# Patient Record
Sex: Male | Born: 1990 | Race: Black or African American | Hispanic: No | Marital: Single | Smoking: Current some day smoker
Health system: Southern US, Community
[De-identification: ages and names within clinical notes are randomized; demographics above are authoritative.]

## PROBLEM LIST (undated history)

## (undated) DIAGNOSIS — J45909 Unspecified asthma, uncomplicated: Secondary | ICD-10-CM

---
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
## 2012-08-04 ENCOUNTER — Ambulatory Visit: Payer: BC Managed Care – PPO

## 2012-08-04 ENCOUNTER — Ambulatory Visit (INDEPENDENT_AMBULATORY_CARE_PROVIDER_SITE_OTHER): Payer: BC Managed Care – PPO | Admitting: Family Medicine

## 2012-08-04 VITALS — BP 110/60 | HR 67 | Temp 98.4°F | Resp 16 | Ht 67.5 in | Wt 153.6 lb

## 2012-08-04 DIAGNOSIS — R634 Abnormal weight loss: Secondary | ICD-10-CM

## 2012-08-04 DIAGNOSIS — K5900 Constipation, unspecified: Secondary | ICD-10-CM

## 2012-08-04 DIAGNOSIS — R109 Unspecified abdominal pain: Secondary | ICD-10-CM

## 2012-08-04 DIAGNOSIS — R112 Nausea with vomiting, unspecified: Secondary | ICD-10-CM

## 2012-08-04 LAB — POCT CBC
Granulocyte percent: 53.3 %G (ref 37–80)
HCT, POC: 52 % (ref 43.5–53.7)
Hemoglobin: 17.5 g/dL (ref 14.1–18.1)
Lymph, poc: 2.5 (ref 0.6–3.4)
MCH, POC: 30.8 pg (ref 27–31.2)
MCHC: 33.7 g/dL (ref 31.8–35.4)
MCV: 91.6 fL (ref 80–97)
MID (cbc): 0.7 (ref 0–0.9)
MPV: 10.4 fL (ref 0–99.8)
POC Granulocyte: 3.7 (ref 2–6.9)
POC LYMPH PERCENT: 36.3 %L (ref 10–50)
POC MID %: 10.4 %M (ref 0–12)
Platelet Count, POC: 311 10*3/uL (ref 142–424)
RBC: 5.68 M/uL (ref 4.69–6.13)
RDW, POC: 12.8 %
WBC: 7 10*3/uL (ref 4.6–10.2)

## 2012-08-04 LAB — POCT URINALYSIS DIPSTICK
Bilirubin, UA: NEGATIVE
Blood, UA: NEGATIVE
Glucose, UA: NEGATIVE
Ketones, UA: NEGATIVE
Leukocytes, UA: NEGATIVE
Nitrite, UA: NEGATIVE
Protein, UA: 100
Spec Grav, UA: 1.025
Urobilinogen, UA: 0.2
pH, UA: 6

## 2012-08-04 NOTE — Progress Notes (Signed)
[facility]:  Office Visit  Chief Complaint:  Chief Complaint  Patient presents with  . Abdominal Pain    nausea vomiting diarrhea constipation last tues    HPI: Jon Clay is a 21 y.o. male who complains of 8 day h/o abd pain.  Abdominal pain diffuse, pressure,, bloating and has both burping and flatulence. Last BM 3-4 days ago.  Normal BM is 2 days. Last BM was watery dark, non bloody. Deneis fevers. + nausea, + chills, + vomitus initially. Prior hisotry of gastroenteritis x 4. Not associated with dairy products, fatty foods. + bilateral back pain. + heartburn, acid taste with burp bad breath.   History reviewed. No pertinent past medical history. History reviewed. No pertinent past surgical history. History   Social History  . Marital Status: Single    Spouse Name: N/A    Number of Children: N/A  . Years of Education: N/A   Social History Main Topics  . Smoking status: Never Smoker   . Smokeless tobacco: None  . Alcohol Use: No  . Drug Use: No  . Sexually Active: None   Other Topics Concern  . None   Social History Narrative  . None   No family history on file. No Known Allergies Prior to Admission medications   Medication Sig Start Date End Date Taking? Authorizing Provider  alum & mag hydroxide-simeth (MAALOX PLUS) 400-400-40 MG/5ML suspension Take by mouth every 6 (six) hours as needed.   Yes Historical Provider, MD  omeprazole (PRILOSEC) 20 MG capsule Take 20 mg by mouth daily.   Yes Historical Provider, MD     ROS: The patient denies fevers, chills, night sweats, unintentional weight loss, chest pain, palpitations, wheezing, dyspnea on exertion,  dysuria, hematuria, melena, numbness, weakness, or tingling.   All other systems have been reviewed and were otherwise negative with the exception of those mentioned in the HPI and as above.    PHYSICAL EXAM: Filed Vitals:   08/04/12 1801  BP: 110/60  Pulse: 67  Temp: 98.4 F (36.9 C)    Resp: 16   Filed Vitals:   08/04/12 1801  Height: 5' 7.5" (1.715 m)  Weight: 153 lb 9.6 oz (69.673 kg)   Body mass index is 23.70 kg/(m^2).  General: Alert, no acute distress HEENT:  Normocephalic, atraumatic, oropharynx patent.  Cardiovascular:  Regular rate and rhythm, no rubs murmurs or gallops.  No Carotid bruits, radial pulse intact. No pedal edema.  Respiratory: Clear to auscultation bilaterally.  No wheezes, rales, or rhonchi.  No cyanosis, no use of accessory musculature GI: No organomegaly, abdomen is soft and currently non-tender, positive bowel sounds.  No masses. Skin: No rashes. Neurologic: Facial musculature symmetric. Psychiatric: Patient is appropriate throughout our interaction. Lymphatic: No cervical lymphadenopathy Musculoskeletal: Gait intact.   LABS: Results for orders placed in visit on 08/04/12  POCT CBC      Component Value Range   WBC 7.0  4.6 - 10.2 K/uL   Lymph, poc 2.5  0.6 - 3.4   POC LYMPH PERCENT 36.3  10 - 50 %L   MID (cbc) 0.7  0 - 0.9   POC MID % 10.4  0 - 12 %M   POC Granulocyte 3.7  2 - 6.9   Granulocyte percent 53.3  37 - 80 %G   RBC 5.68  4.69 - 6.13 M/uL   Hemoglobin 17.5  14.1 - 18.1 g/dL   HCT, POC 45.4  09.8 - 53.7 %   MCV 91.6  80 - 97 fL   MCH, POC 30.8  27 - 31.2 pg   MCHC 33.7  31.8 - 35.4 g/dL   RDW, POC 16.1     Platelet Count, POC 311  142 - 424 K/uL   MPV 10.4  0 - 99.8 fL  POCT URINALYSIS DIPSTICK      Component Value Range   Color, UA yellow     Clarity, UA hazy     Glucose, UA neg     Bilirubin, UA neg     Ketones, UA neg     Spec Grav, UA 1.025     Blood, UA neg     pH, UA 6.0     Protein, UA 100     Urobilinogen, UA 0.2     Nitrite, UA neg     Leukocytes, UA Negative       EKG/XRAY:   Primary read interpreted by Dr. Conley Rolls at [facility]. No acute cardiopulmonary process Abdomen no free air, unobstructed gas pattern, + stool   ASSESSMENT/PLAN: Encounter Diagnoses  Name Primary?  . Abdominal pain Yes  .  Nausea & vomiting   . Weight loss   . Constipation    Take prilosec BID Regiment of Miralax, Senokot S, Colace May be just GERD vs PUD vs Gall bladder disease. Lab pending [phone]    Rockne Coons, DO 08/04/2012 7:07 PM

## 2012-08-05 LAB — COMPREHENSIVE METABOLIC PANEL
ALT: 12 U/L (ref 0–53)
AST: 16 U/L (ref 0–37)
Albumin: 5.6 g/dL — ABNORMAL HIGH (ref 3.5–5.2)
Alkaline Phosphatase: 58 U/L (ref 39–117)
BUN: 17 mg/dL (ref 6–23)
CO2: 29 mEq/L (ref 19–32)
Calcium: 11.4 mg/dL — ABNORMAL HIGH (ref 8.4–10.5)
Chloride: 87 mEq/L — ABNORMAL LOW (ref 96–112)
Creat: 1.26 mg/dL (ref 0.50–1.35)
Glucose, Bld: 121 mg/dL — ABNORMAL HIGH (ref 70–99)
Potassium: 3.8 mEq/L (ref 3.5–5.3)
Sodium: 135 mEq/L (ref 135–145)
Total Bilirubin: 1.5 mg/dL — ABNORMAL HIGH (ref 0.3–1.2)
Total Protein: 9.1 g/dL — ABNORMAL HIGH (ref 6.0–8.3)

## 2012-08-05 LAB — LIPASE: Lipase: <10 U/L (ref 0–75)

## 2012-08-05 LAB — HEPATITIS B SURFACE ANTIBODY, QUANTITATIVE: Hep B S AB Quant (Post): 1 m[IU]/mL

## 2012-08-05 LAB — HEPATITIS C ANTIBODY: HCV Ab: NEGATIVE

## 2012-08-05 LAB — AMYLASE: Amylase: 56 U/L (ref 0–105)

## 2012-08-08 ENCOUNTER — Encounter: Payer: Self-pay | Admitting: Family Medicine

## 2012-08-09 ENCOUNTER — Other Ambulatory Visit: Payer: Self-pay | Admitting: Family Medicine

## 2012-08-09 DIAGNOSIS — B9681 Helicobacter pylori [H. pylori] as the cause of diseases classified elsewhere: Secondary | ICD-10-CM

## 2012-08-09 LAB — HELICOBACTER PYLORI  ANTIBODY, IGM: Helicobacter pylori, IgM: 14 U/mL — ABNORMAL HIGH (ref <9.0)

## 2012-08-09 MED ORDER — METRONIDAZOLE 500 MG PO TABS: 500.0000 mg | ORAL_TABLET | Freq: Two times a day (BID) | ORAL | Status: AC

## 2012-08-09 MED ORDER — CLARITHROMYCIN 500 MG PO TABS: 500.0000 mg | ORAL_TABLET | Freq: Two times a day (BID) | ORAL | Status: AC

## 2012-08-10 ENCOUNTER — Encounter: Payer: Self-pay | Admitting: Family Medicine

## 2012-08-26 ENCOUNTER — Telehealth: Payer: Self-pay

## 2012-08-26 NOTE — Telephone Encounter (Signed)
Advised pt that it would be best for him to RTC to see Dr Conley Rolls so that he can discuss w/her what is needed in a letter and to have her recheck him. Dr Conley Rolls wanted to recheck his calcium level also. Pt agreed to come in this weekend.

## 2012-08-26 NOTE — Telephone Encounter (Signed)
Pt came into clinic yesterday to have proof of his diagnosis for stomach issues from dr Conley Rolls. i printed copy of his ov note and letter dr Conley Rolls sent him with diagnosis included. Pt needs for school purposes. Pt called today to state school would only accept that to cover that one day. Pt states he has experienced 'flare ups' for the past two years on average of twice a month resulting in missing class for up to 3 or 4 days total. He asks that dr Conley Rolls call him to discuss writing a more detailed letter for school. He said he might come in on Sunday during her office hours to see her as an ov if he doesn't hear from her by then. States its very important and time sensitive.   Pt best: [phone]  bf

## 2012-08-28 ENCOUNTER — Ambulatory Visit (INDEPENDENT_AMBULATORY_CARE_PROVIDER_SITE_OTHER): Payer: BC Managed Care – PPO | Admitting: Family Medicine

## 2012-08-28 VITALS — BP 122/68 | HR 83 | Temp 98.7°F | Resp 17 | Ht 68.0 in | Wt 165.0 lb

## 2012-08-28 DIAGNOSIS — R102 Pelvic and perineal pain: Secondary | ICD-10-CM

## 2012-08-28 DIAGNOSIS — R197 Diarrhea, unspecified: Secondary | ICD-10-CM

## 2012-08-28 DIAGNOSIS — R109 Unspecified abdominal pain: Secondary | ICD-10-CM

## 2012-08-28 DIAGNOSIS — R6889 Other general symptoms and signs: Secondary | ICD-10-CM

## 2012-08-28 DIAGNOSIS — R350 Frequency of micturition: Secondary | ICD-10-CM

## 2012-08-28 DIAGNOSIS — R110 Nausea: Secondary | ICD-10-CM

## 2012-08-28 LAB — POCT CBC
Granulocyte percent: 54.5 %G (ref 37–80)
HCT, POC: 46.6 % (ref 43.5–53.7)
Hemoglobin: 14.8 g/dL (ref 14.1–18.1)
Lymph, poc: 1.8 (ref 0.6–3.4)
MCH, POC: 30.3 pg (ref 27–31.2)
MCHC: 31.8 g/dL (ref 31.8–35.4)
MCV: 95.2 fL (ref 80–97)
MID (cbc): 0.4 (ref 0–0.9)
MPV: 9.8 fL (ref 0–99.8)
POC Granulocyte: 2.7 (ref 2–6.9)
POC LYMPH PERCENT: 36.7 %L (ref 10–50)
POC MID %: 8.8 %M (ref 0–12)
Platelet Count, POC: 268 10*3/uL (ref 142–424)
RBC: 4.89 M/uL (ref 4.69–6.13)
RDW, POC: 14.8 %
WBC: 4.9 10*3/uL (ref 4.6–10.2)

## 2012-08-28 LAB — POCT URINALYSIS DIPSTICK
Bilirubin, UA: NEGATIVE
Blood, UA: NEGATIVE
Glucose, UA: NEGATIVE
Ketones, UA: NEGATIVE
Leukocytes, UA: NEGATIVE
Nitrite, UA: NEGATIVE
Protein, UA: NEGATIVE
Spec Grav, UA: 1.02
Urobilinogen, UA: 0.2
pH, UA: 7.5

## 2012-08-28 LAB — POCT UA - MICROSCOPIC ONLY
Casts, Ur, LPF, POC: NEGATIVE
Crystals, Ur, HPF, POC: NEGATIVE
Epithelial cells, urine per micros: NEGATIVE
Mucus, UA: NEGATIVE
Yeast, UA: NEGATIVE

## 2012-08-28 NOTE — Patient Instructions (Addendum)
We will refer you to a gastroenterologist.  Call if you have a specific one you would like to be referred to.  Bland diet, avoid fast food, spicy food, fried or greasy foods.  Continue prilosec twice per day. Your should receive a call or letter about your lab results within the next week to 10 days.  Let us know the previous places of treatment so we can request records.  Return to the clinic or go to the nearest emergency room if any of your symptoms worsen or new symptoms occur.

## 2012-08-28 NOTE — Progress Notes (Signed)
Subjective:    Patient ID: Jon Clay, male    DOB: 10/23/1991, 21 y.o.   MRN: [id number]  HPI Jon Clay is a 21 y.o. male Seen by Dr. Conley Rolls previously on 08/04/12 with 8 day hx of abdominal cramping.  He states he was given information form Dr. Conley Rolls that this may have been condition since childhood. Needs note.  Drove from [place]. Moving back to [place] this week.   Last in school from August through may of this year in [place] - [employer].  Did not enroll in this year's term.  Not in school now, planning on going back to school  - [employer]. Has to file appeal due to academic probation at [employer] spring 2012  - due to 23 days of missed classes. Missed too many days.   Attributes these missed days to repeated episodes of gastroenteritis.  Seen by nurse that was a family friend that year at home.  No office visit for these illnesses. Never received doctor's note during those spells in 2012.  Seen by [facility] health care in [place] during spring of this year for these spells.  Admitted to [facility] in [place], [place] in  January of this year, and [facility] in [place], [place] - end of March this year.    12 big "flare ups" over past 5 years - vomiting, and constipation feels better for few hours, spells go on 6 to 10 days. can't go to school during these spells. Has had multiple smaller flares in the past.  Has not seen Gastroenterologist.  Treated with prilosec bid at 08/04/12 office visit. Labs noted Hpylori. See below (nonfasting): Results for orders placed in visit on 08/04/12  POCT CBC      Component Value Range   WBC 7.0  4.6 - 10.2 K/uL   Lymph, poc 2.5  0.6 - 3.4   POC LYMPH PERCENT 36.3  10 - 50 %L   MID (cbc) 0.7  0 - 0.9   POC MID % 10.4  0 - 12 %M   POC Granulocyte 3.7  2 - 6.9   Granulocyte percent 53.3  37 - 80 %G   RBC 5.68  4.69 - 6.13 M/uL   Hemoglobin 17.5  14.1 - 18.1 g/dL   HCT, POC 40.9  81.1 - 53.7 %   MCV 91.6  80 - 97 fL   MCH, POC  30.8  27 - 31.2 pg   MCHC 33.7  31.8 - 35.4 g/dL   RDW, POC 91.4     Platelet Count, POC 311  142 - 424 K/uL   MPV 10.4  0 - 99.8 fL  POCT URINALYSIS DIPSTICK      Component Value Range   Color, UA yellow     Clarity, UA hazy     Glucose, UA neg     Bilirubin, UA neg     Ketones, UA neg     Spec Grav, UA 1.025     Blood, UA neg     pH, UA 6.0     Protein, UA 100     Urobilinogen, UA 0.2     Nitrite, UA neg     Leukocytes, UA Negative    LIPASE      Component Value Range   Lipase <10  0 - 75 U/L  AMYLASE      Component Value Range   Amylase 56  0 - 105 U/L  COMPREHENSIVE METABOLIC PANEL      Component Value  Range   Sodium 135  135 - 145 mEq/L   Potassium 3.8  3.5 - 5.3 mEq/L   Chloride 87 (*) 96 - 112 mEq/L   CO2 29  19 - 32 mEq/L   Glucose, Bld 121 (*) 70 - 99 mg/dL   BUN 17  6 - 23 mg/dL   Creat 1.19  1.47 - 8.29 mg/dL   Total Bilirubin 1.5 (*) 0.3 - 1.2 mg/dL   Alkaline Phosphatase 58  39 - 117 U/L   AST 16  0 - 37 U/L   ALT 12  0 - 53 U/L   Total Protein 9.1 (*) 6.0 - 8.3 g/dL   Albumin 5.6 (*) 3.5 - 5.2 g/dL   Calcium 56.2 (*) 8.4 - 10.5 mg/dL  HEPATITIS C ANTIBODY      Component Value Range   HCV Ab NEGATIVE  NEGATIVE  HEPATITIS B SURFACE ANTIBODY, QUANTITATIVE      Component Value Range   Hepatitis B-Post 1.0    HELICOBACTER PYLORI  ANTIBODY, IGM      Component Value Range   Helicobacter pylori, IgM 14.0 (*) <9.0 U/mL   Continued on prilosec BID, added flagyl 500mg  BID #28, Biaxin 500mg  BID #28. Still feels ome morning sickness  - nausea, with occasional spitting of pink discolored spit.  No vomiting.  Still with abdominal cramping. Feels like it hurts more now that on meds.  Diarrhea every BM since getting sick - runny stool and green.  When gets sick - turns into constipation.  No recent fever.  Not having "bad pain" like previous flare ups since being on thi medicine.    Review of Systems  Constitutional: Positive for unexpected weight change (weight  176 in spring 2013.  now 165.  no intentional weight loss. ). Negative for fever and chills.  Gastrointestinal: Positive for nausea, abdominal pain, diarrhea and blood in stool (during "flare ups" this year - dark stools but told it was from pepto bismol.Marland Kitchen). Negative for vomiting.  Genitourinary: Positive for frequency (urinary frequency since July. ). Negative for dysuria and urgency.  Skin: Negative for rash.   As above.    Objective:   Physical Exam  Constitutional: He is oriented to person, place, and time. He appears well-developed and well-nourished.  HENT:  Head: Normocephalic and atraumatic.  Pulmonary/Chest: Effort normal.  Abdominal: Soft. Bowel sounds are normal. There is tenderness (suprapubic, wthout guarding.).  Neurological: He is alert and oriented to person, place, and time.  Skin: Skin is warm and dry.  Psychiatric: He has a normal mood and affect. His behavior is normal.   Results for orders placed in visit on 08/28/12  POCT CBC      Component Value Range   WBC 4.9  4.6 - 10.2 K/uL   Lymph, poc 1.8  0.6 - 3.4   POC LYMPH PERCENT 36.7  10 - 50 %L   MID (cbc) 0.4  0 - 0.9   POC MID % 8.8  0 - 12 %M   POC Granulocyte 2.7  2 - 6.9   Granulocyte percent 54.5  37 - 80 %G   RBC 4.89  4.69 - 6.13 M/uL   Hemoglobin 14.8  14.1 - 18.1 g/dL   HCT, POC 13.0  86.5 - 53.7 %   MCV 95.2  80 - 97 fL   MCH, POC 30.3  27 - 31.2 pg   MCHC 31.8  31.8 - 35.4 g/dL   RDW, POC 78.4     Platelet  Count, POC 268  142 - 424 K/uL   MPV 9.8  0 - 99.8 fL  POCT URINALYSIS DIPSTICK      Component Value Range   Color, UA yellow     Clarity, UA slightly cloudy     Glucose, UA neg     Bilirubin, UA neg     Ketones, UA neg     Spec Grav, UA 1.020     Blood, UA neg     pH, UA 7.5     Protein, UA neg     Urobilinogen, UA 0.2     Nitrite, UA neg     Leukocytes, UA Negative    POCT UA - MICROSCOPIC ONLY      Component Value Range   WBC, Ur, HPF, POC 3-5     RBC, urine, microscopic 0-1      Bacteria, U Microscopic trace     Mucus, UA neg     Epithelial cells, urine per micros neg     Crystals, Ur, HPF, POC neg     Casts, Ur, LPF, POC neg     Yeast, UA neg          Assessment & Plan:  Jon Clay is a 21 y.o. male  1. Abdominal pain, suprapubic  POCT CBC, Comprehensive metabolic panel, PSA, Ambulatory referral to Gastroenterology  2. Abnormal clinical finding    3. Urinary frequency  PSA, POCT urinalysis dipstick, POCT UA - Microscopic Only  4. Nausea  Ambulatory referral to Gastroenterology  5. Diarrhea  Ambulatory referral to Gastroenterology   Complicated history of recurrent abdominal complaints by history over past 5 years, with eval in other hospitals and clinics.  Recurrent diarrhea and then constipation with nausea/vomiting illnesses.  Initial OV here few weeks ago with positive H pylori, hypercalcemia, hyperproteinemia and hyperbilirubinemia.  Under treatment for H pylori, but with chronicity of symptoms and reported weight loss and other sx's - will refer to GI.  Continue prilosec BID and complete tx for H pylori. Will try to obtain outside records for here and GI, but discussed with pt that I am unable to complete a letter at this point for his absences last year.  Will forward OV to Dr Conley Rolls as Lorain Childes. RTC precautions discussed.  Recommended bland diet with fiber and avoid fried/fatty foods.   Urinary frequency - check PSA, urine culture with few WBC, but doubt UTI.  rtc if increase in symptoms.   rtc precautions discussed and understanding expressed.   Patient Instructions  We will refer you to a gastroenterologist.  Call if you have a specific one you would like to be referred to.  Bland diet, avoid fast food, spicy food, fried or greasy foods.  Continue prilosec twice per day. Your should receive a call or letter about your lab results within the next week to 10 days.  Let us know the previous places of treatment so we can request records.  Return to the  clinic or go to the nearest emergency room if any of your symptoms worsen or new symptoms occur.

## 2012-08-29 LAB — COMPREHENSIVE METABOLIC PANEL
ALT: 33 U/L (ref 0–53)
AST: 24 U/L (ref 0–37)
Albumin: 4.8 g/dL (ref 3.5–5.2)
Alkaline Phosphatase: 44 U/L (ref 39–117)
BUN: 7 mg/dL (ref 6–23)
CO2: 27 mEq/L (ref 19–32)
Calcium: 10 mg/dL (ref 8.4–10.5)
Chloride: 105 mEq/L (ref 96–112)
Creat: 0.95 mg/dL (ref 0.50–1.35)
Glucose, Bld: 97 mg/dL (ref 70–99)
Potassium: 4.2 mEq/L (ref 3.5–5.3)
Sodium: 140 mEq/L (ref 135–145)
Total Bilirubin: 0.6 mg/dL (ref 0.3–1.2)
Total Protein: 7.4 g/dL (ref 6.0–8.3)

## 2012-08-29 LAB — PSA: PSA: 1.07 ng/mL (ref <=4.00)

## 2012-08-31 LAB — URINE CULTURE
Colony Count: NO GROWTH
Organism ID, Bacteria: NO GROWTH

## 2013-03-15 ENCOUNTER — Ambulatory Visit (INDEPENDENT_AMBULATORY_CARE_PROVIDER_SITE_OTHER): Payer: BC Managed Care – PPO | Admitting: Family Medicine

## 2013-03-15 VITALS — BP 130/81 | HR 54 | Temp 98.0°F | Resp 16 | Ht 68.0 in | Wt 152.2 lb

## 2013-03-15 DIAGNOSIS — K219 Gastro-esophageal reflux disease without esophagitis: Secondary | ICD-10-CM

## 2013-03-15 DIAGNOSIS — R1013 Epigastric pain: Secondary | ICD-10-CM

## 2013-03-15 DIAGNOSIS — R112 Nausea with vomiting, unspecified: Secondary | ICD-10-CM

## 2013-03-15 LAB — POCT CBC
Granulocyte percent: 73.8 %G (ref 37–80)
HCT, POC: 54 % — AB (ref 43.5–53.7)
Hemoglobin: 18 g/dL (ref 14.1–18.1)
Lymph, poc: 2.2 (ref 0.6–3.4)
MCH, POC: 30.7 pg (ref 27–31.2)
MCHC: 33.3 g/dL (ref 31.8–35.4)
MCV: 92.1 fL (ref 80–97)
MID (cbc): 0.6 (ref 0–0.9)
MPV: 10 fL (ref 0–99.8)
POC Granulocyte: 7.8 — AB (ref 2–6.9)
POC LYMPH PERCENT: 20.4 %L (ref 10–50)
POC MID %: 5.8 %M (ref 0–12)
Platelet Count, POC: 350 10*3/uL (ref 142–424)
RBC: 5.86 M/uL (ref 4.69–6.13)
RDW, POC: 13.8 %
WBC: 10.6 10*3/uL — AB (ref 4.6–10.2)

## 2013-03-15 LAB — IFOBT (OCCULT BLOOD): IFOBT: NEGATIVE

## 2013-03-15 MED ORDER — OMEPRAZOLE 20 MG PO CPDR: 20.0000 mg | DELAYED_RELEASE_CAPSULE | Freq: Every day | ORAL | Status: DC

## 2013-03-15 MED ORDER — GI COCKTAIL ~~LOC~~: 30.0000 mL | Freq: Two times a day (BID) | ORAL | Status: DC

## 2013-03-15 MED ORDER — ONDANSETRON 8 MG PO TBDP: 8.0000 mg | ORAL_TABLET | Freq: Three times a day (TID) | ORAL | Status: DC | PRN

## 2013-03-15 NOTE — Progress Notes (Signed)
Subjective:    Patient ID: Jon Clay, male    DOB: 1991/06/12, 22 y.o.   MRN: [id number]  HPI   Jon Clay is a 22 yr old male here to discuss chronic stomach problems.  He has been seen here for this twice before, last in Sept 2013.  Previous notes reviewed.  Pt describes "flares" of abdominal pain, vomiting, constipation, and gas.  States the current episode as been present about 4 days now, and he knows it's "wearing off" and that he just needs to get through the next day or so.  These episodes come about every other month.  Today he complains of epigastric abdominal pain and vomiting.  Endorses heart burn and reflux symptoms.  Often wakes up feeling like he has to vomit - occasionally does vomit, sometimes has a bowel movement and feels better.  Feels gassy.  Previously diagnosed and treated for H. Pylori but doesn't think the antibiotic "got it all out".  Was told that perhaps it would take multiple treatments.  Pt states he is often constipated, but states last BM was today.  Describes it as "jelly like and dark".  Denies BRBPR.  Endorses mucus in the stool.  Stools are often dark.  States his appetite varies, sometimes difficult to keep food down.  Trying to stay hydrated but if he drinks too much water at one time he will vomit.  Pt is salivating profusely while giving the history, often spitting.  States he thinks the saliva irritates his stomach.  Currently he is taking omeprazole PRN and rolaids PRN for symptoms.  He has never seen GI.  A referral was placed at last visit, unclear why he did not follow through.  States he is thinking about getting in with a GI practice in [place], though he lives here in [place].     Review of Systems  Constitutional: Positive for appetite change. Negative for fever and chills.  HENT: Positive for drooling.   Respiratory: Negative.   Cardiovascular: Negative.   Gastrointestinal: Positive for nausea, vomiting, abdominal pain and constipation.   Musculoskeletal: Negative.   Skin: Negative.   Neurological: Negative.        Objective:   Physical Exam  Vitals reviewed. Constitutional: He is oriented to person, place, and time. He appears well-developed and well-nourished. No distress.  HENT:  Head: Normocephalic and atraumatic.  Pt with excessive salivation  Eyes: Conjunctivae are normal. No scleral icterus.  Cardiovascular: Normal rate, regular rhythm and normal heart sounds.  Exam reveals no gallop and no friction rub.   No murmur heard. Pulmonary/Chest: Effort normal and breath sounds normal. He has no wheezes. He has no rales.  Abdominal: Soft. Normal appearance and bowel sounds are normal. There is no splenomegaly or hepatomegaly. There is tenderness in the epigastric area, periumbilical area and suprapubic area. There is no rigidity, no rebound and no guarding. No hernia.  Neurological: He is alert and oriented to person, place, and time.  Skin: Skin is warm and dry.  Psychiatric: He has a normal mood and affect. His behavior is normal.     Filed Vitals:   03/15/13 2017  BP: 130/81  Pulse: 54  Temp: 98 F (36.7 C)  Resp: 16      Results for orders placed in visit on 03/15/13  POCT CBC      Result Value Range   WBC 10.6 (*) 4.6 - 10.2 K/uL   Lymph, poc 2.2  0.6 - 3.4   POC LYMPH PERCENT 20.4  10 - 50 %L   MID (cbc) 0.6  0 - 0.9   POC MID % 5.8  0 - 12 %M   POC Granulocyte 7.8 (*) 2 - 6.9   Granulocyte percent 73.8  37 - 80 %G   RBC 5.86  4.69 - 6.13 M/uL   Hemoglobin 18.0  14.1 - 18.1 g/dL   HCT, POC 08.6 (*) 57.8 - 53.7 %   MCV 92.1  80 - 97 fL   MCH, POC 30.7  27 - 31.2 pg   MCHC 33.3  31.8 - 35.4 g/dL   RDW, POC 46.9     Platelet Count, POC 350  142 - 424 K/uL   MPV 10.0  0 - 99.8 fL  IFOBT (OCCULT BLOOD)      Result Value Range   IFOBT Negative         Assessment & Plan:  Abdominal pain, epigastric - Plan: POCT CBC, IFOBT POC (occult bld, rslt in office), H. pylori antibody, IgG,  ondansetron (ZOFRAN-ODT) 8 MG disintegrating tablet, omeprazole (PRILOSEC) 20 MG capsule, Alum & Mag Hydroxide-Simeth (GI COCKTAIL) SUSP suspension, Ambulatory referral to Gastroenterology  Nausea with vomiting - Plan: ondansetron (ZOFRAN-ODT) 8 MG disintegrating tablet, omeprazole (PRILOSEC) 20 MG capsule, Alum & Mag Hydroxide-Simeth (GI COCKTAIL) SUSP suspension, Ambulatory referral to Gastroenterology  GERD (gastroesophageal reflux disease) - Plan: ondansetron (ZOFRAN-ODT) 8 MG disintegrating tablet, omeprazole (PRILOSEC) 20 MG capsule, Alum & Mag Hydroxide-Simeth (GI COCKTAIL) SUSP suspension, Ambulatory referral to Gastroenterology   Jon Clay is a 22 yr old male here with a long history of GI problems including abdominal pain, reflux, vomiting, constipation.  He has had a 13 lb weight loss since last visit in Sept 2013.  He describes recurrent episodes of these symptoms.  Today CBC shows a slightly elevated white count at 10.6.  H/H normal.  FOBT negative. He does not present with an acute abdomen today. Pt experienced significant relief with administration of GI cocktail.  Discussed with pt that his symptoms are concerning, esp given their chronicity.  Differential includes IBD, PUD, IBS.  I do not think he necessarily needs imaging at this point, though I discussed with pt that we may need to proceed with this should symptoms worsen or not improve.  Discussed with pt that the most important thing is that he be evaluated by GI for further diagnostic testing.  He likely needs to undergo endoscopy and/or colonoscopy.  I have placed a referral to GI today, hopefully he can be seen in the next week.  Encouraged pt to resume Prilosec as directed.  Discussed that this is not a prn medication and works over time with consistent use.  Zofran q8h prn nausea.  GI cocktail BID prn.  If any symptoms acutely worsen, or if new symptoms develop, prior to GI eval, pt will RTC or proceed to the ED.  Pt expressed  understanding and is in agreement with this plan.

## 2013-03-15 NOTE — Patient Instructions (Addendum)
Resume taking Prilosec daily - this helps suppress acid long term.  Zofran every 8 hours as needed for nausea.  You may use the GI cocktail twice daily if needed.  You should be receiving a phone call about getting your GI appointment schedule.  If any symptoms are worsening prior to seeing GI, please come back in or go to the emergency department.

## 2013-03-16 NOTE — Progress Notes (Signed)
Xray read and patient discussed with Ms. Egan. Agree with assessment and plan of care per her note.   

## 2013-03-17 LAB — H. PYLORI ANTIBODY, IGG: H Pylori IgG: 0.6 {ISR}

## 2013-03-17 MED ORDER — GI COCKTAIL ~~LOC~~: 30.0000 mL | Freq: Two times a day (BID) | ORAL | Status: DC

## 2013-03-17 NOTE — Addendum Note (Signed)
Addended by: Godfrey Pick on: 03/17/2013 03:46 PM   Modules accepted: Orders

## 2014-11-02 ENCOUNTER — Encounter (HOSPITAL_COMMUNITY): Payer: Self-pay | Admitting: Emergency Medicine

## 2014-11-02 ENCOUNTER — Emergency Department (HOSPITAL_COMMUNITY)
Admission: EM | Admit: 2014-11-02 | Discharge: 2014-11-03 | Disposition: A | Discharge status: Home / Self Care | Payer: BC Managed Care – PPO | Attending: Emergency Medicine | Admitting: Emergency Medicine

## 2014-11-02 DIAGNOSIS — R1013 Epigastric pain: Secondary | ICD-10-CM | POA: Insufficient documentation

## 2014-11-02 DIAGNOSIS — Z79899 Other long term (current) drug therapy: Secondary | ICD-10-CM | POA: Diagnosis not present

## 2014-11-02 DIAGNOSIS — R109 Unspecified abdominal pain: Secondary | ICD-10-CM | POA: Diagnosis present

## 2014-11-02 DIAGNOSIS — R112 Nausea with vomiting, unspecified: Secondary | ICD-10-CM | POA: Diagnosis not present

## 2014-11-02 LAB — COMPREHENSIVE METABOLIC PANEL
ALT: 18 U/L (ref 0–53)
AST: 21 U/L (ref 0–37)
Albumin: 4.7 g/dL (ref 3.5–5.2)
Alkaline Phosphatase: 61 U/L (ref 39–117)
Anion gap: 17 — ABNORMAL HIGH (ref 5–15)
BUN: 18 mg/dL (ref 6–23)
CO2: 26 mEq/L (ref 19–32)
Calcium: 9.9 mg/dL (ref 8.4–10.5)
Chloride: 97 mEq/L (ref 96–112)
Creatinine, Ser: 1.11 mg/dL (ref 0.50–1.35)
GFR calc Af Amer: >90 mL/min (ref >90)
GFR calc non Af Amer: >90 mL/min (ref >90)
Glucose, Bld: 127 mg/dL — ABNORMAL HIGH (ref 70–99)
Potassium: 3.4 mEq/L — ABNORMAL LOW (ref 3.7–5.3)
Sodium: 140 mEq/L (ref 137–147)
Total Bilirubin: 1.1 mg/dL (ref 0.3–1.2)
Total Protein: 8.8 g/dL — ABNORMAL HIGH (ref 6.0–8.3)

## 2014-11-02 LAB — URINALYSIS, ROUTINE W REFLEX MICROSCOPIC
Glucose, UA: NEGATIVE mg/dL
Hgb urine dipstick: NEGATIVE
Ketones, ur: 15 mg/dL — AB
Leukocytes, UA: NEGATIVE
Nitrite: NEGATIVE
Protein, ur: 100 mg/dL — AB
Specific Gravity, Urine: 1.037 — ABNORMAL HIGH (ref 1.005–1.030)
Urobilinogen, UA: 0.2 mg/dL (ref 0.0–1.0)
pH: 6 (ref 5.0–8.0)

## 2014-11-02 LAB — CBC WITH DIFFERENTIAL/PLATELET
Basophils Absolute: 0 10*3/uL (ref 0.0–0.1)
Basophils Relative: 0 % (ref 0–1)
Eosinophils Absolute: 0 10*3/uL (ref 0.0–0.7)
Eosinophils Relative: 0 % (ref 0–5)
HCT: 47 % (ref 39.0–52.0)
Hemoglobin: 16.9 g/dL (ref 13.0–17.0)
Lymphocytes Relative: 23 % (ref 12–46)
Lymphs Abs: 1.8 10*3/uL (ref 0.7–4.0)
MCH: 31.1 pg (ref 26.0–34.0)
MCHC: 36 g/dL (ref 30.0–36.0)
MCV: 86.4 fL (ref 78.0–100.0)
Monocytes Absolute: 0.6 10*3/uL (ref 0.1–1.0)
Monocytes Relative: 8 % (ref 3–12)
Neutro Abs: 5.2 10*3/uL (ref 1.7–7.7)
Neutrophils Relative %: 69 % (ref 43–77)
Platelets: 291 10*3/uL (ref 150–400)
RBC: 5.44 MIL/uL (ref 4.22–5.81)
RDW: 12.5 % (ref 11.5–15.5)
WBC: 7.6 10*3/uL (ref 4.0–10.5)

## 2014-11-02 LAB — LIPASE, BLOOD: Lipase: 20 U/L (ref 11–59)

## 2014-11-02 LAB — URINE MICROSCOPIC-ADD ON

## 2014-11-02 MED ORDER — GI COCKTAIL ~~LOC~~
30.0000 mL | Freq: Once | ORAL | Status: AC
  Administered 2014-11-03: 30 mL via ORAL

## 2014-11-02 NOTE — ED Notes (Signed)
Pt. reports mid/low abdominal pain with emesis onset Tuesday this week , denies diarrhea , no fever or chills.

## 2014-11-02 NOTE — ED Provider Notes (Signed)
CSN: [id number]     Arrival date & time 11/02/14  2108 History  This chart was scribed for Joya Gaskinsonald W Katelen Luepke, MD by Elon SpannerGarrett Cook, ED Scribe. This patient was seen in room B14C/B14C and the patient's care was started at 11:42 PM.   Chief Complaint  Patient presents with  . Abdominal Pain   Patient is a 23 y.o. male presenting with abdominal pain. The history is provided by the patient. No language interpreter was used.  Abdominal Pain Pain location:  Epigastric Pain radiates to:  Does not radiate Pain severity:  Moderate Duration:  3 days Timing:  Constant Progression:  Unchanged Chronicity:  Recurrent Relieved by:  Nothing Worsened by:  Eating Ineffective treatments:  None tried Associated symptoms: nausea and vomiting   Associated symptoms: no chest pain, no diarrhea and no shortness of breath   Nausea:    Severity:  Moderate   Onset quality:  Sudden   Duration:  3 days   Timing:  Constant   Progression:  Unchanged Vomiting:    Severity:  Moderate   Duration:  3 days   Timing:  Intermittent   Progression:  Unchanged  HPI Comments: Jon SpindleWilliam Clay is a 23 y.o. male who presents to the Emergency Department complaining of abdominal pain with associated nausea and vomiting onset 12/1.  Patient reports on 12/1 he drank a glass of water and subsequently began vomiting yellowish liquid without blood.  This has persisted since that time.  Patient reports a history of H. Pylori and previous history of similar episodes that he states have been relieved by a GI cocktail.  Patient reports these episodes occurred less frequently in the past when he was taking Prilosec.  Patient is not tolerating foods well.  Patient denies history of abdominal surgeries.  Patient denies taking any medications currently.  Patient denies melena, diarrhea, bloody stool, fever, CP, SOB.     PMH - none  History  Substance Use Topics  . Smoking status: Never Smoker   . Smokeless tobacco: Not on file  . Alcohol  Use: No    Review of Systems  Respiratory: Negative for shortness of breath.   Cardiovascular: Negative for chest pain.  Gastrointestinal: Positive for nausea, vomiting and abdominal pain. Negative for diarrhea and blood in stool.  All other systems reviewed and are negative.     Allergies  Review of patient's allergies indicates no known allergies.  Home Medications   Prior to Admission medications   Medication Sig Start Date End Date Taking? Authorizing Provider  Alum & Mag Hydroxide-Simeth (GI COCKTAIL) SUSP suspension Take 30 mLs by mouth 2 (two) times daily. Shake well. 03/17/13   Eleanore Delia ChimesE Egan, PA-C  alum & mag hydroxide-simeth (MAALOX PLUS) 400-400-40 MG/5ML suspension Take by mouth every 6 (six) hours as needed.    Historical Provider, MD  omeprazole (PRILOSEC) 20 MG capsule Take 1 capsule (20 mg total) by mouth daily. 03/15/13   Eleanore Delia ChimesE Egan, PA-C  ondansetron (ZOFRAN-ODT) 8 MG disintegrating tablet Take 1 tablet (8 mg total) by mouth every 8 (eight) hours as needed for nausea. 03/15/13   Eleanore E Egan, PA-C   BP 112/75 mmHg  Pulse 82  Temp(Src) 98.1 F (36.7 C) (Oral)  Resp 16  Ht 5\' 8"  (1.727 m)  Wt 165 lb (74.844 kg)  BMI 25.09 kg/m2  SpO2 99% Physical Exam  Nursing note and vitals reviewed.  CONSTITUTIONAL: Well developed/well nourished HEAD: Normocephalic/atraumatic EYES: EOMI/PERRL ENMT: Mucous membranes moist NECK: supple no meningeal signs  SPINE/BACK:entire spine nontender CV: S1/S2 noted, no murmurs/rubs/gallops noted LUNGS: Lungs are clear to auscultation bilaterally, no apparent distress ABDOMEN: soft, nontender, no rebound or guarding, bowel sounds noted throughout abdomen GU:no cva tenderness NEURO: Pt is awake/alert/appropriate, moves all extremitiesx4.  No facial droop.   EXTREMITIES: pulses normal/equal, full ROM SKIN: warm, color normal PSYCH: no abnormalities of mood noted, alert and oriented to situation  ED Course  Procedures    DIAGNOSTIC STUDIES: Oxygen Saturation is 99% on RA, normal by my interpretation.    COORDINATION OF CARE:  11:48 PM Discussed plans to order GI cocktail and refer to gastroenterologist.  Patient acknowledges and agrees with plan.    Pt well appearing, no distress, taking PO and no focal abdominal tenderness He is appropriate for outpatient management BP 135/60 mmHg  Pulse 63  Temp(Src) 98.3 F (36.8 C) (Oral)  Resp 16  Ht 5\' 8"  (1.727 m)  Wt 165 lb (74.844 kg)  BMI 25.09 kg/m2  SpO2 100%   Labs Review Labs Reviewed  COMPREHENSIVE METABOLIC PANEL - Abnormal; Notable for the following:    Potassium 3.4 (*)    Glucose, Bld 127 (*)    Total Protein 8.8 (*)    Anion gap 17 (*)    All other components within normal limits  URINALYSIS, ROUTINE W REFLEX MICROSCOPIC - Abnormal; Notable for the following:    Color, Urine AMBER (*)    APPearance CLOUDY (*)    Specific Gravity, Urine 1.037 (*)    Bilirubin Urine SMALL (*)    Ketones, ur 15 (*)    Protein, ur 100 (*)    All other components within normal limits  CBC WITH DIFFERENTIAL  LIPASE, BLOOD  URINE MICROSCOPIC-ADD ON     Medications  gi cocktail (Maalox,Lidocaine,Donnatal) (30 mLs Oral Given 11/03/14 0032)     MDM   Final diagnoses:  Abdominal pain, unspecified abdominal location    Nursing notes including past medical history and social history reviewed and considered in documentation Labs/vital reviewed myself and considered during evaluation   I personally performed the services described in this documentation, which was scribed in my presence. The recorded information has been reviewed and is accurate.      Joya Gaskinsonald W Aireana Ryland, MD 11/03/14 [phone]

## 2014-11-03 MED ORDER — OMEPRAZOLE 20 MG PO CPDR: 20.0000 mg | DELAYED_RELEASE_CAPSULE | Freq: Every day | ORAL | Status: DC

## 2014-11-03 NOTE — Discharge Instructions (Signed)

## 2015-03-01 ENCOUNTER — Emergency Department (HOSPITAL_COMMUNITY)
Admission: EM | Admit: 2015-03-01 | Discharge: 2015-03-02 | Discharge status: Against Medical Advice | Payer: Self-pay | Attending: Emergency Medicine | Admitting: Emergency Medicine

## 2015-03-01 ENCOUNTER — Encounter (HOSPITAL_COMMUNITY): Payer: Self-pay | Admitting: Emergency Medicine

## 2015-03-01 DIAGNOSIS — R109 Unspecified abdominal pain: Secondary | ICD-10-CM | POA: Insufficient documentation

## 2015-03-01 LAB — CBC WITH DIFFERENTIAL/PLATELET
BASOS PCT: 0 % (ref 0–1)
Basophils Absolute: 0 10*3/uL (ref 0.0–0.1)
EOS ABS: 0.1 10*3/uL (ref 0.0–0.7)
EOS PCT: 1 % (ref 0–5)
HCT: 51.6 % (ref 39.0–52.0)
HEMOGLOBIN: 18.4 g/dL — AB (ref 13.0–17.0)
Lymphocytes Relative: 30 % (ref 12–46)
Lymphs Abs: 2.2 10*3/uL (ref 0.7–4.0)
MCH: 31.6 pg (ref 26.0–34.0)
MCHC: 35.7 g/dL (ref 30.0–36.0)
MCV: 88.5 fL (ref 78.0–100.0)
MONOS PCT: 12 % (ref 3–12)
Monocytes Absolute: 0.9 10*3/uL (ref 0.1–1.0)
NEUTROS ABS: 4 10*3/uL (ref 1.7–7.7)
NEUTROS PCT: 57 % (ref 43–77)
Platelets: 316 10*3/uL (ref 150–400)
RBC: 5.83 MIL/uL — ABNORMAL HIGH (ref 4.22–5.81)
RDW: 12.9 % (ref 11.5–15.5)
WBC: 7.2 10*3/uL (ref 4.0–10.5)

## 2015-03-01 LAB — URINALYSIS, ROUTINE W REFLEX MICROSCOPIC
Bilirubin Urine: NEGATIVE
Glucose, UA: NEGATIVE mg/dL
HGB URINE DIPSTICK: NEGATIVE
KETONES UR: 40 mg/dL — AB
Nitrite: NEGATIVE
Protein, ur: 100 mg/dL — AB
Specific Gravity, Urine: 1.039 — ABNORMAL HIGH (ref 1.005–1.030)
UROBILINOGEN UA: 0.2 mg/dL (ref 0.0–1.0)
pH: 5 (ref 5.0–8.0)

## 2015-03-01 LAB — URINE MICROSCOPIC-ADD ON

## 2015-03-01 LAB — COMPREHENSIVE METABOLIC PANEL
ALK PHOS: 63 U/L (ref 39–117)
ALT: 22 U/L (ref 0–53)
ANION GAP: 15 (ref 5–15)
AST: 28 U/L (ref 0–37)
Albumin: 5.5 g/dL — ABNORMAL HIGH (ref 3.5–5.2)
BUN: 27 mg/dL — AB (ref 6–23)
CALCIUM: 10.4 mg/dL (ref 8.4–10.5)
CO2: 29 mmol/L (ref 19–32)
Chloride: 93 mmol/L — ABNORMAL LOW (ref 96–112)
Creatinine, Ser: 1.22 mg/dL (ref 0.50–1.35)
GFR calc Af Amer: >90 mL/min (ref >90)
GFR calc non Af Amer: 81 mL/min — ABNORMAL LOW (ref >90)
Glucose, Bld: 105 mg/dL — ABNORMAL HIGH (ref 70–99)
Potassium: 3.6 mmol/L (ref 3.5–5.1)
Sodium: 137 mmol/L (ref 135–145)
TOTAL PROTEIN: 9.7 g/dL — AB (ref 6.0–8.3)
Total Bilirubin: 2 mg/dL — ABNORMAL HIGH (ref 0.3–1.2)

## 2015-03-01 NOTE — ED Notes (Signed)
Bed: WA19 Expected date:  Expected time:  Means of arrival:  Comments: triage 

## 2015-03-01 NOTE — ED Notes (Signed)
Per patient states abdominal pain since Sunday-states he is unable to keep anything down-no relief with OTC meds-states he did feel bad after he drank beer-was told by PCP to come to ED for GI cocktail

## 2015-08-07 ENCOUNTER — Encounter (HOSPITAL_COMMUNITY): Payer: Self-pay | Admitting: *Deleted

## 2015-08-07 ENCOUNTER — Emergency Department (HOSPITAL_COMMUNITY): Payer: Self-pay

## 2015-08-07 ENCOUNTER — Emergency Department (HOSPITAL_COMMUNITY)
Admission: EM | Admit: 2015-08-07 | Discharge: 2015-08-07 | Disposition: A | Discharge status: Home / Self Care | Payer: Self-pay | Attending: Emergency Medicine | Admitting: Emergency Medicine

## 2015-08-07 DIAGNOSIS — R110 Nausea: Secondary | ICD-10-CM | POA: Insufficient documentation

## 2015-08-07 DIAGNOSIS — G8929 Other chronic pain: Secondary | ICD-10-CM | POA: Insufficient documentation

## 2015-08-07 DIAGNOSIS — R51 Headache: Secondary | ICD-10-CM | POA: Insufficient documentation

## 2015-08-07 DIAGNOSIS — R1084 Generalized abdominal pain: Secondary | ICD-10-CM | POA: Insufficient documentation

## 2015-08-07 LAB — CBC
HCT: 49.1 % (ref 39.0–52.0)
Hemoglobin: 17.6 g/dL — ABNORMAL HIGH (ref 13.0–17.0)
MCH: 32.1 pg (ref 26.0–34.0)
MCHC: 35.8 g/dL (ref 30.0–36.0)
MCV: 89.6 fL (ref 78.0–100.0)
PLATELETS: 235 10*3/uL (ref 150–400)
RBC: 5.48 MIL/uL (ref 4.22–5.81)
RDW: 13 % (ref 11.5–15.5)
WBC: 8.2 10*3/uL (ref 4.0–10.5)

## 2015-08-07 LAB — URINALYSIS, ROUTINE W REFLEX MICROSCOPIC
GLUCOSE, UA: NEGATIVE mg/dL
Hgb urine dipstick: NEGATIVE
KETONES UR: 15 mg/dL — AB
LEUKOCYTES UA: NEGATIVE
Nitrite: NEGATIVE
PROTEIN: 30 mg/dL — AB
Specific Gravity, Urine: 1.041 — ABNORMAL HIGH (ref 1.005–1.030)
Urobilinogen, UA: 0.2 mg/dL (ref 0.0–1.0)
pH: 5.5 (ref 5.0–8.0)

## 2015-08-07 LAB — COMPREHENSIVE METABOLIC PANEL
ALK PHOS: 55 U/L (ref 38–126)
ALT: 27 U/L (ref 17–63)
AST: 39 U/L (ref 15–41)
Albumin: 5.2 g/dL — ABNORMAL HIGH (ref 3.5–5.0)
Anion gap: 13 (ref 5–15)
BUN: 29 mg/dL — AB (ref 6–20)
CALCIUM: 10.1 mg/dL (ref 8.9–10.3)
CO2: 26 mmol/L (ref 22–32)
CREATININE: 1.2 mg/dL (ref 0.61–1.24)
Chloride: 97 mmol/L — ABNORMAL LOW (ref 101–111)
GFR calc Af Amer: >60 mL/min (ref >60)
Glucose, Bld: 102 mg/dL — ABNORMAL HIGH (ref 65–99)
Potassium: 3.8 mmol/L (ref 3.5–5.1)
Sodium: 136 mmol/L (ref 135–145)
Total Bilirubin: 1.5 mg/dL — ABNORMAL HIGH (ref 0.3–1.2)
Total Protein: 9 g/dL — ABNORMAL HIGH (ref 6.5–8.1)

## 2015-08-07 LAB — LIPASE, BLOOD: Lipase: 15 U/L — ABNORMAL LOW (ref 22–51)

## 2015-08-07 LAB — URINE MICROSCOPIC-ADD ON

## 2015-08-07 MED ORDER — POLYETHYLENE GLYCOL 3350 17 GM/SCOOP PO POWD: 17.0000 g | Freq: Two times a day (BID) | ORAL | Status: DC

## 2015-08-07 MED ORDER — POLYETHYLENE GLYCOL 3350 17 G PO PACK
17.0000 g | PACK | Freq: Every day | ORAL | Status: DC
  Administered 2015-08-07: 17 g via ORAL
  Filled 2015-08-07: qty 1

## 2015-08-07 MED ORDER — OMEPRAZOLE 20 MG PO CPDR: 20.0000 mg | DELAYED_RELEASE_CAPSULE | Freq: Every day | ORAL | Status: DC

## 2015-08-07 NOTE — ED Provider Notes (Signed)
CSN: [id number]     Arrival date & time 08/07/15  1529 History   First MD Initiated Contact with Patient 08/07/15 [phone]     Chief Complaint  Patient presents with  . Headache  . Nausea  . Abdominal Pain     (Consider location/radiation/quality/duration/timing/severity/associated sxs/prior Treatment) HPI Jon Clay is a 24 y.o. male with a history of chronic abdominal pain comes in for evaluation of acute abdominal pain. Patient states he had been seen by his PCP for this problem, was originally started on Prevacid which seemed to help his symptoms. Patient reports since he has had multiple visits to his PCP, he now has a balance and they will not see him until he pays his balance. He reports his most recent episode started on Saturday when he thinks he ate too much. He reports sitting in church on Sunday feeling like he needed to have bowel movement, but could not. He reports then trying to throw up in order to relieve his discomfort, but denies any spontaneous nausea or vomiting. He reports he has been passing gas. Patient reports his abdominal discomfort today is typical of his previous chronic abdominal discomfort. He denies any fevers, chills, dark or bloody stools. Denies any alcohol or drug use. He denies any discomfort now in ED, only states "I feel gassy".  History reviewed. No pertinent past medical history. History reviewed. No pertinent past surgical history. History reviewed. No pertinent family history. Social History  Substance Use Topics  . Smoking status: Never Smoker   . Smokeless tobacco: None  . Alcohol Use: No     Comment: Pt states "I stopped drinking way before this summer"    Review of Systems A 10 point review of systems was completed and was negative except for pertinent positives and negatives as mentioned in the history of present illness     Allergies  Review of patient's allergies indicates no known allergies.  Home Medications   Prior to Admission  medications   Medication Sig Start Date End Date Taking? Authorizing Provider  Simethicone (GAS-X EXTRA STRENGTH) 125 MG CAPS Take 1 capsule by mouth daily as needed (indigestion).   Yes Historical Provider, MD  simethicone (GAS-X) 80 MG chewable tablet Chew 80 mg by mouth 4 (four) times daily as needed for flatulence (indigestion).   Yes Historical Provider, MD  omeprazole (PRILOSEC) 20 MG capsule Take 1 capsule (20 mg total) by mouth daily. 08/07/15   Joycie Peek, PA-C  polyethylene glycol powder (GLYCOLAX/MIRALAX) powder Take 17 g by mouth 2 (two) times daily. Until daily soft stools  OTC 08/07/15   Joycie Peek, PA-C   BP 141/85 mmHg  Pulse 64  Temp(Src) 98.1 F (36.7 C) (Oral)  Resp 13  SpO2 99% Physical Exam  Constitutional: He is oriented to person, place, and time. He appears well-developed and well-nourished.  HENT:  Head: Normocephalic and atraumatic.  Mouth/Throat: Oropharynx is clear and moist.  Eyes: Conjunctivae are normal. Pupils are equal, round, and reactive to light. Right eye exhibits no discharge. Left eye exhibits no discharge. No scleral icterus.  Neck: Neck supple.  Cardiovascular: Normal rate, regular rhythm and normal heart sounds.   Pulmonary/Chest: Effort normal and breath sounds normal. No respiratory distress. He has no wheezes. He has no rales.  Abdominal: Soft. There is no tenderness.  Abdomen is soft, nondistended. No focal tenderness. No rebound or guarding. No lesions or deformities.  Musculoskeletal: He exhibits no tenderness.  Neurological: He is alert and oriented to person, place,  and time.  Cranial Nerves II-XII grossly intact  Skin: Skin is warm and dry. No rash noted.  Psychiatric: He has a normal mood and affect.  Nursing note and vitals reviewed.   ED Course  Procedures (including critical care time) Labs Review Labs Reviewed  LIPASE, BLOOD - Abnormal; Notable for the following:    Lipase 15 (*)    All other components within  normal limits  COMPREHENSIVE METABOLIC PANEL - Abnormal; Notable for the following:    Chloride 97 (*)    Glucose, Bld 102 (*)    BUN 29 (*)    Total Protein 9.0 (*)    Albumin 5.2 (*)    Total Bilirubin 1.5 (*)    All other components within normal limits  CBC - Abnormal; Notable for the following:    Hemoglobin 17.6 (*)    All other components within normal limits  URINALYSIS, ROUTINE W REFLEX MICROSCOPIC (NOT AT [facility]) - Abnormal; Notable for the following:    Color, Urine AMBER (*)    Specific Gravity, Urine 1.041 (*)    Bilirubin Urine SMALL (*)    Ketones, ur 15 (*)    Protein, ur 30 (*)    All other components within normal limits  URINE MICROSCOPIC-ADD ON    Imaging Review Dg Abd Acute W/chest  08/07/2015   CLINICAL DATA:  Umbilical abdomen pain nausea and constipation since Sunday.  EXAM: DG ABDOMEN ACUTE W/ 1V CHEST  COMPARISON:  August 04, 2012  FINDINGS: There is no evidence of dilated bowel loops or free intraperitoneal air. There is mild bowel content identified in the colon. No radiopaque calculi or other significant radiographic abnormality is seen. Heart size and mediastinal contours are within normal limits. Both lungs are clear.  IMPRESSION: Negative abdominal radiographs.  No acute cardiopulmonary disease.   Electronically Signed   By: Sherian Rein M.D.   On: 08/07/2015 17:46   I have personally reviewed and evaluated these images and lab results as part of my medical decision-making.   EKG Interpretation None     Meds given in ED:  Medications  polyethylene glycol (MIRALAX / GLYCOLAX) packet 17 g (17 g Oral Given 08/07/15 1904)    New Prescriptions   OMEPRAZOLE (PRILOSEC) 20 MG CAPSULE    Take 1 capsule (20 mg total) by mouth daily.   POLYETHYLENE GLYCOL POWDER (GLYCOLAX/MIRALAX) POWDER    Take 17 g by mouth 2 (two) times daily. Until daily soft stools  OTC   Filed Vitals:   08/07/15 1533 08/07/15 1744  BP: 122/78 141/85  Pulse: 107 64  Temp: 98.1  F (36.7 C)   TempSrc: Oral   Resp: 20 13  SpO2: 100% 99%    MDM  Vitals stable - WNL -afebrile Pt resting comfortably in ED. PE--abdominal exam benign. Physical exam is otherwise not concerning. No objective findings. Labwork--labs are grossly noncontributory and appear to be baseline for patient. Although, there is some evidence of dehydration on urinalysis with specific gravity of 1.041 Imaging-x-ray acute abdomen is negative for any acute intra-abdominal pathology. No overt stool burden.  DDX--this appears to be a chronic problem for the patient. Patient treated with MiraLAX in the ED for possible discomfort secondary to mild constipation, is tolerating oral fluids. No nausea or vomiting in the ED. States he feels better and is ready to go home and is requesting a work note. No evidence of acute or emergent intra-abdominal pathology at this time. Low suspicion for SBO, appendicitis, viscus perforation, etc..  Will DC with MiraLAX and will also restart PPI. Given referral to [facility] in order to establish primary care. Patient stable, in good condition and appropriate for discharge.   I discussed all relevant lab findings and imaging results with pt and they verbalized understanding. Discussed f/u with PCP within 48 hrs and return precautions, pt very amenable to plan.  Final diagnoses:  Abdominal discomfort, generalized        Joycie Peek, PA-C 08/07/15 1907  Alvira Monday, MD 08/09/15 1228

## 2015-08-07 NOTE — Discharge Instructions (Signed)
There is not appear to be an emergent cause for her abdominal pain at this time. Your exam, labs and x-rays were all very reassuring. Please take your medications as prescribed. Follow-up with [facility] for reevaluation and primary care. Return to ED for new or worsening symptoms.  Abdominal Pain Many things can cause abdominal pain. Usually, abdominal pain is not caused by a disease and will improve without treatment. It can often be observed and treated at home. Your health care provider will do a physical exam and possibly order blood tests and X-rays to help determine the seriousness of your pain. However, in many cases, more time must pass before a clear cause of the pain can be found. Before that point, your health care provider may not know if you need more testing or further treatment. HOME CARE INSTRUCTIONS  Monitor your abdominal pain for any changes. The following actions may help to alleviate any discomfort you are experiencing:  Only take over-the-counter or prescription medicines as directed by your health care provider.  Do not take laxatives unless directed to do so by your health care provider.  Try a clear liquid diet (broth, tea, or water) as directed by your health care provider. Slowly move to a bland diet as tolerated. SEEK MEDICAL CARE IF:  You have unexplained abdominal pain.  You have abdominal pain associated with nausea or diarrhea.  You have pain when you urinate or have a bowel movement.  You experience abdominal pain that wakes you in the night.  You have abdominal pain that is worsened or improved by eating food.  You have abdominal pain that is worsened with eating fatty foods.  You have a fever. SEEK IMMEDIATE MEDICAL CARE IF:   Your pain does not go away within 2 hours.  You keep throwing up (vomiting).  Your pain is felt only in portions of the abdomen, such as the right side or the left lower portion of the abdomen.  You pass  bloody or black tarry stools. MAKE SURE YOU:  Understand these instructions.   Will watch your condition.   Will get help right away if you are not doing well or get worse.  Document Released: 08/26/2005 Document Revised: 11/21/2013 Document Reviewed: 07/26/2013 [facility] Patient Information 2015 [place], [place]. This information is not intended to replace advice given to you by your health care provider. Make sure you discuss any questions you have with your health care provider.

## 2015-08-07 NOTE — ED Notes (Addendum)
Pt does not want iv inserted at this time.  He states he is only here "because of a prerequisite for his job"

## 2015-08-07 NOTE — ED Notes (Addendum)
Pt states "I keep having these issues.  I feel like I have an ulcer or something.  I tried to see my primary care doctor, Celesta Gentile but they told me I have a balance.  I have asked them to send me to a specialist.  I have a h/a, have nausea, and my stomach hurts some times.  For a long time it hurt every day but then it stopped."

## 2016-10-11 ENCOUNTER — Emergency Department (HOSPITAL_COMMUNITY)
Admission: EM | Admit: 2016-10-11 | Discharge: 2016-10-11 | Disposition: A | Discharge status: Home / Self Care | Payer: Self-pay | Attending: Emergency Medicine | Admitting: Emergency Medicine

## 2016-10-11 ENCOUNTER — Emergency Department (HOSPITAL_COMMUNITY): Payer: Self-pay

## 2016-10-11 ENCOUNTER — Encounter (HOSPITAL_COMMUNITY): Payer: Self-pay | Admitting: *Deleted

## 2016-10-11 DIAGNOSIS — Z79899 Other long term (current) drug therapy: Secondary | ICD-10-CM | POA: Insufficient documentation

## 2016-10-11 DIAGNOSIS — R059 Cough, unspecified: Secondary | ICD-10-CM

## 2016-10-11 DIAGNOSIS — R05 Cough: Secondary | ICD-10-CM | POA: Insufficient documentation

## 2016-10-11 DIAGNOSIS — F17200 Nicotine dependence, unspecified, uncomplicated: Secondary | ICD-10-CM | POA: Insufficient documentation

## 2016-10-11 DIAGNOSIS — J45909 Unspecified asthma, uncomplicated: Secondary | ICD-10-CM | POA: Insufficient documentation

## 2016-10-11 HISTORY — DX: Unspecified asthma, uncomplicated: J45.909

## 2016-10-11 LAB — CBC
HEMATOCRIT: 50.3 % (ref 39.0–52.0)
Hemoglobin: 17.4 g/dL — ABNORMAL HIGH (ref 13.0–17.0)
MCH: 31.5 pg (ref 26.0–34.0)
MCHC: 34.6 g/dL (ref 30.0–36.0)
MCV: 91 fL (ref 78.0–100.0)
Platelets: 271 10*3/uL (ref 150–400)
RBC: 5.53 MIL/uL (ref 4.22–5.81)
RDW: 12.5 % (ref 11.5–15.5)
WBC: 6.5 10*3/uL (ref 4.0–10.5)

## 2016-10-11 LAB — URINALYSIS, ROUTINE W REFLEX MICROSCOPIC
BILIRUBIN URINE: NEGATIVE
Glucose, UA: NEGATIVE mg/dL
HGB URINE DIPSTICK: NEGATIVE
Ketones, ur: NEGATIVE mg/dL
NITRITE: NEGATIVE
PH: 6 (ref 5.0–8.0)
Protein, ur: NEGATIVE mg/dL
Specific Gravity, Urine: 1.037 — ABNORMAL HIGH (ref 1.005–1.030)

## 2016-10-11 LAB — COMPREHENSIVE METABOLIC PANEL
ALBUMIN: 4.7 g/dL (ref 3.5–5.0)
ALT: 25 U/L (ref 17–63)
ANION GAP: 9 (ref 5–15)
AST: 27 U/L (ref 15–41)
Alkaline Phosphatase: 59 U/L (ref 38–126)
BUN: 18 mg/dL (ref 6–20)
CO2: 29 mmol/L (ref 22–32)
Calcium: 9.5 mg/dL (ref 8.9–10.3)
Chloride: 99 mmol/L — ABNORMAL LOW (ref 101–111)
Creatinine, Ser: 1.33 mg/dL — ABNORMAL HIGH (ref 0.61–1.24)
GFR calc non Af Amer: >60 mL/min (ref >60)
GLUCOSE: 76 mg/dL (ref 65–99)
POTASSIUM: 3.7 mmol/L (ref 3.5–5.1)
Sodium: 137 mmol/L (ref 135–145)
Total Bilirubin: 1.2 mg/dL (ref 0.3–1.2)
Total Protein: 7.9 g/dL (ref 6.5–8.1)

## 2016-10-11 LAB — URINE MICROSCOPIC-ADD ON

## 2016-10-11 LAB — LIPASE, BLOOD: LIPASE: 22 U/L (ref 11–51)

## 2016-10-11 MED ORDER — BENZONATATE 100 MG PO CAPS: 100.0000 mg | ORAL_CAPSULE | Freq: Three times a day (TID) | ORAL | 0 refills | Status: DC | PRN

## 2016-10-11 NOTE — ED Triage Notes (Signed)
Pt also reports pain in the LLQ of abd.

## 2016-10-11 NOTE — Discharge Instructions (Signed)
Your labs and chest xray today are normal.  Start taking tessalon perles three times daily as needed for cough.  You may take Mucinex as well.  Follow up with your primary care physician.  Return to the ED for sudden severe abdominal pain, fever, bloody stools, inability to keep fluids down, or any new or concerning symptoms.

## 2016-10-11 NOTE — ED Provider Notes (Signed)
[facility]-EMERGENCY DEPT Provider Note   CSN: [id number] Arrival date & time: 10/11/16  1535     History   Chief Complaint Chief Complaint  Patient presents with  . Cough  . Abdominal Pain    HPI Jon Clay is a 25 y.o. male.  HPI Jon Clay is a 25 y.o. male with PMH significant for asthma who presents with 5 day gradual onset, constant, now resolved generalized abdominal pain with associated N/V and loose stools.  No bloody stools.  He states he vomited about 15 times on Wednesday.  He states his symptoms have completely resolved.  No medications PTA.  No fever, chills, CP, SOB, or urinary symptoms.  No prior abdominal surgeries.  He secondarily complains of a productive cough that began over the last couple of days. He smokes about 2 black and milds a day. No sick contacts. He is asking for a work note.   Past Medical History:  Diagnosis Date  . Asthma     There are no active problems to display for this patient.   History reviewed. No pertinent surgical history.     Home Medications    Prior to Admission medications   Medication Sig Start Date End Date Taking? Authorizing Provider  benzonatate (TESSALON) 100 MG capsule Take 1 capsule (100 mg total) by mouth 3 (three) times daily as needed for cough. 10/11/16   Cheri FowlerKayla Taejah Ohalloran, PA-C  omeprazole (PRILOSEC) 20 MG capsule Take 1 capsule (20 mg total) by mouth daily. Patient not taking: Reported on 10/11/2016 08/07/15   Joycie PeekBenjamin Cartner, PA-C  polyethylene glycol powder (GLYCOLAX/MIRALAX) powder Take 17 g by mouth 2 (two) times daily. Until daily soft stools  OTC Patient not taking: Reported on 10/11/2016 08/07/15   Joycie PeekBenjamin Cartner, PA-C  Simethicone (GAS-X EXTRA STRENGTH) 125 MG CAPS Take 1 capsule by mouth daily as needed (indigestion).    Historical Provider, MD  simethicone (GAS-X) 80 MG chewable tablet Chew 80 mg by mouth 4 (four) times daily as needed for flatulence (indigestion).    Historical Provider, MD     Family History No family history on file.  Social History Social History  Substance Use Topics  . Smoking status: Current Some Day Smoker  . Smokeless tobacco: Never Used  . Alcohol use No     Comment: Pt states "I stopped drinking way before this summer"     Allergies   Patient has no known allergies.   Review of Systems Review of Systems All other systems negative unless otherwise stated in HPI   Physical Exam Updated Vital Signs BP (!) 124/53   Pulse 92   Temp 98.2 F (36.8 C) (Oral)   Resp 16   Wt 79.8 kg   SpO2 97%   BMI 26.76 kg/m   Physical Exam  Constitutional: He is oriented to person, place, and time. He appears well-developed and well-nourished.  Non-toxic appearance. He does not have a sickly appearance. He does not appear ill.  HENT:  Head: Normocephalic and atraumatic.  Mouth/Throat: Oropharynx is clear and moist.  Eyes: Conjunctivae are normal.  Neck: Normal range of motion. Neck supple.  Cardiovascular: Normal rate and regular rhythm.   Pulmonary/Chest: Effort normal and breath sounds normal. No accessory muscle usage or stridor. No respiratory distress. He has no wheezes. He has no rhonchi. He has no rales.  Abdominal: Soft. Bowel sounds are normal. He exhibits no distension. There is no tenderness. There is no rebound and no guarding.  Musculoskeletal: Normal range of motion.  Lymphadenopathy:    He has no cervical adenopathy.  Neurological: He is alert and oriented to person, place, and time.  Speech clear without dysarthria.  Skin: Skin is warm and dry.  Psychiatric: He has a normal mood and affect. His behavior is normal.   All other systems negative unless otherwise stated in HPI   ED Treatments / Results  Labs (all labs ordered are listed, but only abnormal results are displayed) Labs Reviewed  COMPREHENSIVE METABOLIC PANEL - Abnormal; Notable for the following:       Result Value   Chloride 99 (*)    Creatinine, Ser 1.33 (*)     All other components within normal limits  CBC - Abnormal; Notable for the following:    Hemoglobin 17.4 (*)    All other components within normal limits  URINALYSIS, ROUTINE W REFLEX MICROSCOPIC (NOT AT [facility]) - Abnormal; Notable for the following:    Color, Urine AMBER (*)    APPearance CLOUDY (*)    Specific Gravity, Urine 1.037 (*)    Leukocytes, UA TRACE (*)    All other components within normal limits  URINE MICROSCOPIC-ADD ON - Abnormal; Notable for the following:    Squamous Epithelial / LPF 0-5 (*)    Bacteria, UA RARE (*)    All other components within normal limits  URINE CULTURE  LIPASE, BLOOD    EKG  EKG Interpretation None       Radiology Dg Chest 2 View  Result Date: 10/11/2016 CLINICAL DATA:  Dry cough for 2 weeks EXAM: CHEST  2 VIEW COMPARISON:  08/07/2015 FINDINGS: Normal mediastinum and cardiac silhouette. Normal pulmonary vasculature. No evidence of effusion, infiltrate, or pneumothorax. No acute bony abnormality. IMPRESSION: Normal chest radiograph Electronically Signed   By: Genevive BiStewart  Edmunds M.D.   On: 10/11/2016 16:37    Procedures Procedures (including critical care time)  Medications Ordered in ED Medications - No data to display   Initial Impression / Assessment and Plan / ED Course  I have reviewed the triage vital signs and the nursing notes.  Pertinent labs & imaging results that were available during my care of the patient were reviewed by me and considered in my medical decision making (see chart for details).  Clinical Course    Patient presents with now resolved N/V/D and generalized abdominal pain.  Currently denies any symptoms other than productive cough.  Vitals reassuring.  He does not appear dehydrated on exam.  Abdomen soft and benign without rebound, guarding, or rigidity.  No localized tenderness.  Labs without acute abnormalities, creatinine appears baseline.  UA without obvious sign of infection, rare bacteria.  However with  6-30 WBCs culture sent, he is asymptomatic so no indication for abx. Patient tolerate PO without difficulty.  CXR clear.  At this time, no indication for further imaging.  Low suspicion for acute infectious or surgical abdomen. This appears to be a chronic problem based on chart review.  Cough likely viral in nature.  Plan to discharge home with tessalon perles and recommend mucinex.  Follow up PCP.  Return precautions discussed.  Stable for discharge.    Final Clinical Impressions(s) / ED Diagnoses   Final diagnoses:  Cough    New Prescriptions New Prescriptions   BENZONATATE (TESSALON) 100 MG CAPSULE    Take 1 capsule (100 mg total) by mouth 3 (three) times daily as needed for cough.     Cheri FowlerKayla Emmer Lillibridge, PA-C 10/11/16 1809    Bethann BerkshireJoseph Zammit, MD 10/11/16 [phone]

## 2016-10-11 NOTE — ED Triage Notes (Signed)
Pt reports being sick since Tuesday.  Pt reports he has a productive cough that is causing his mucus to build up in his stomach.  Pt also states it feels like it's hard to cough up the mucus.  Pt denies n/v/fevers/ chills.

## 2016-10-13 LAB — URINE CULTURE: Culture: NO GROWTH

## 2018-03-17 IMAGING — CR DG CHEST 2V
2 series · 2 of 2 positions shown · non-contrast
Comparison: 08/07/2015

CLINICAL DATA: Dry cough for 2 weeks

EXAM:
CHEST  2 VIEW

[w chest pa]
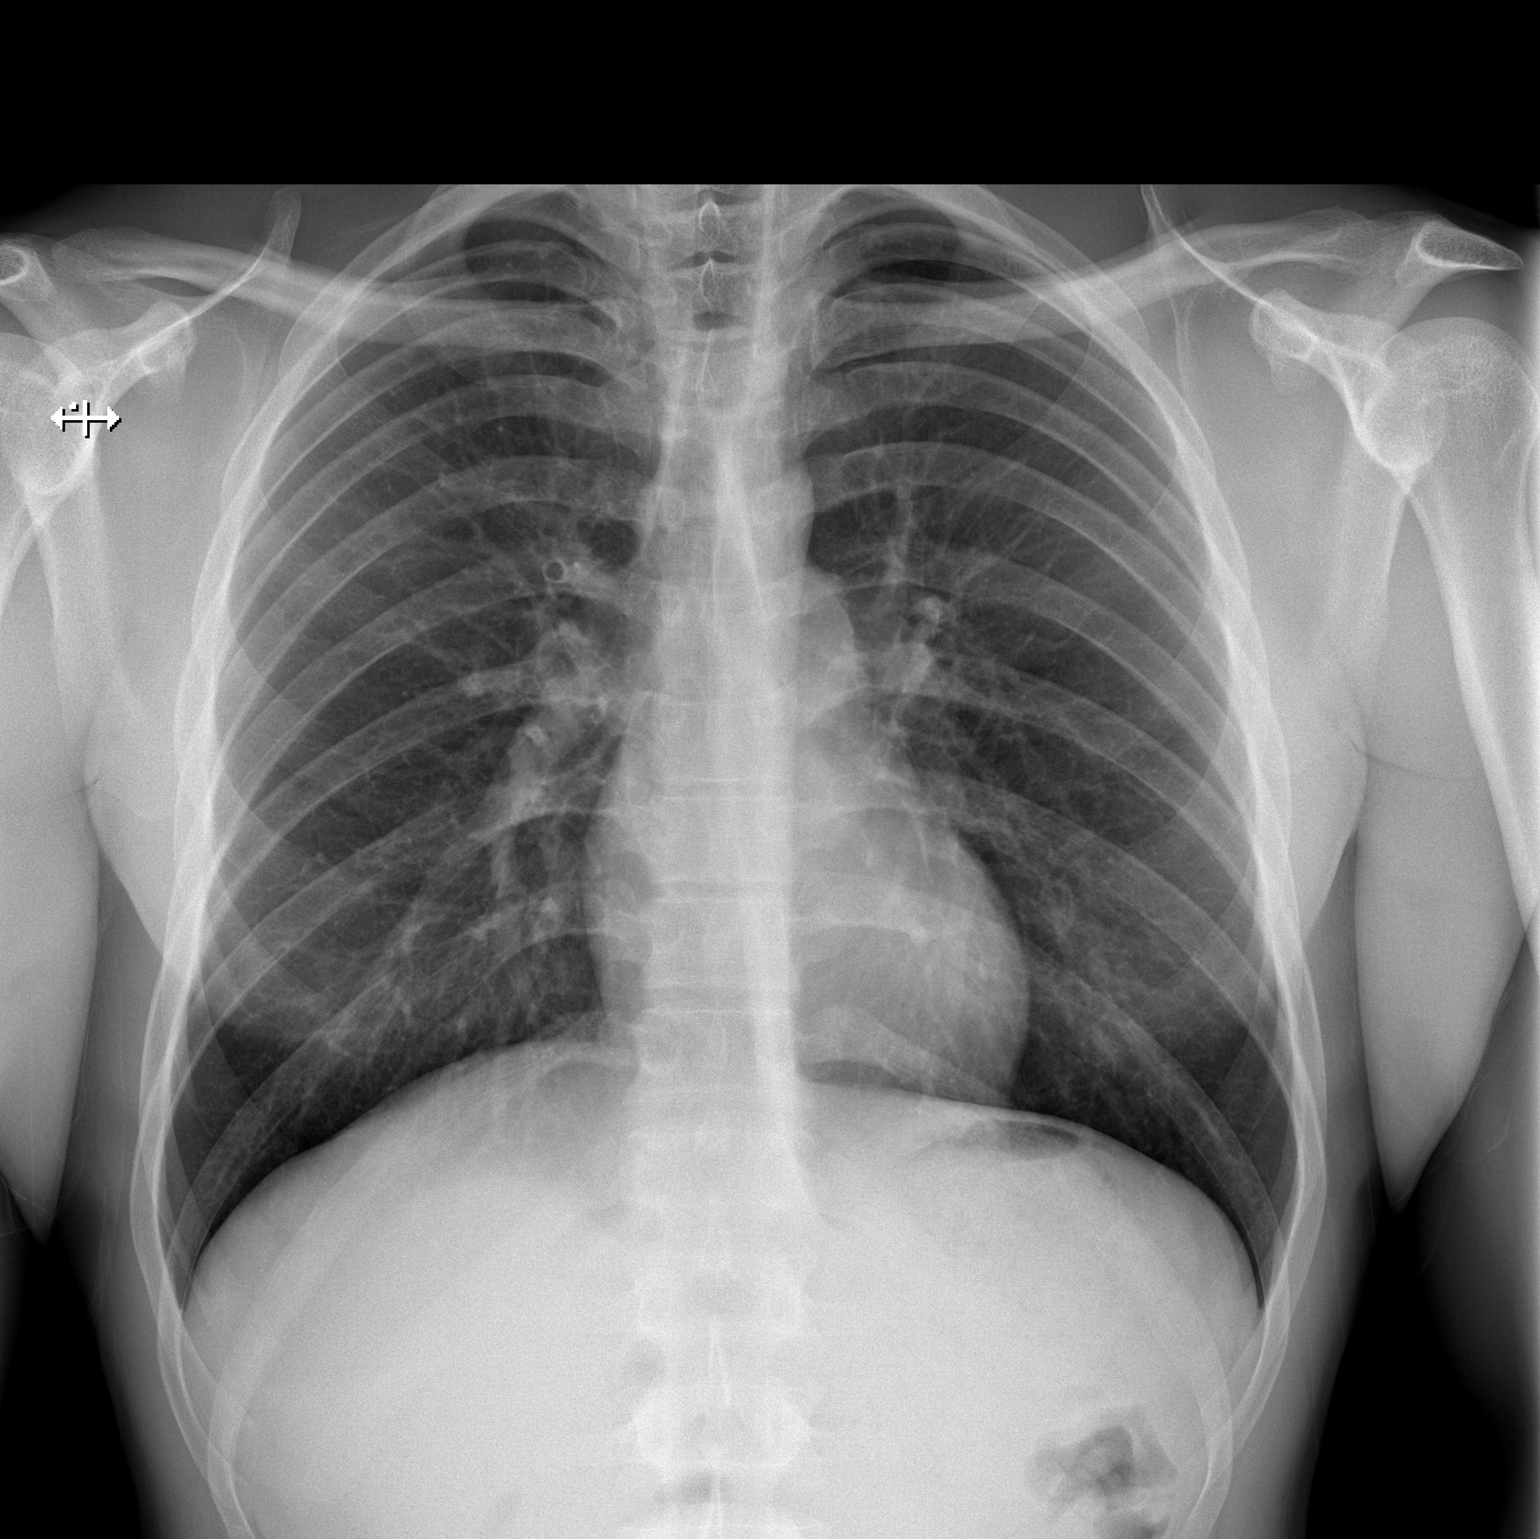

[w chest lat]
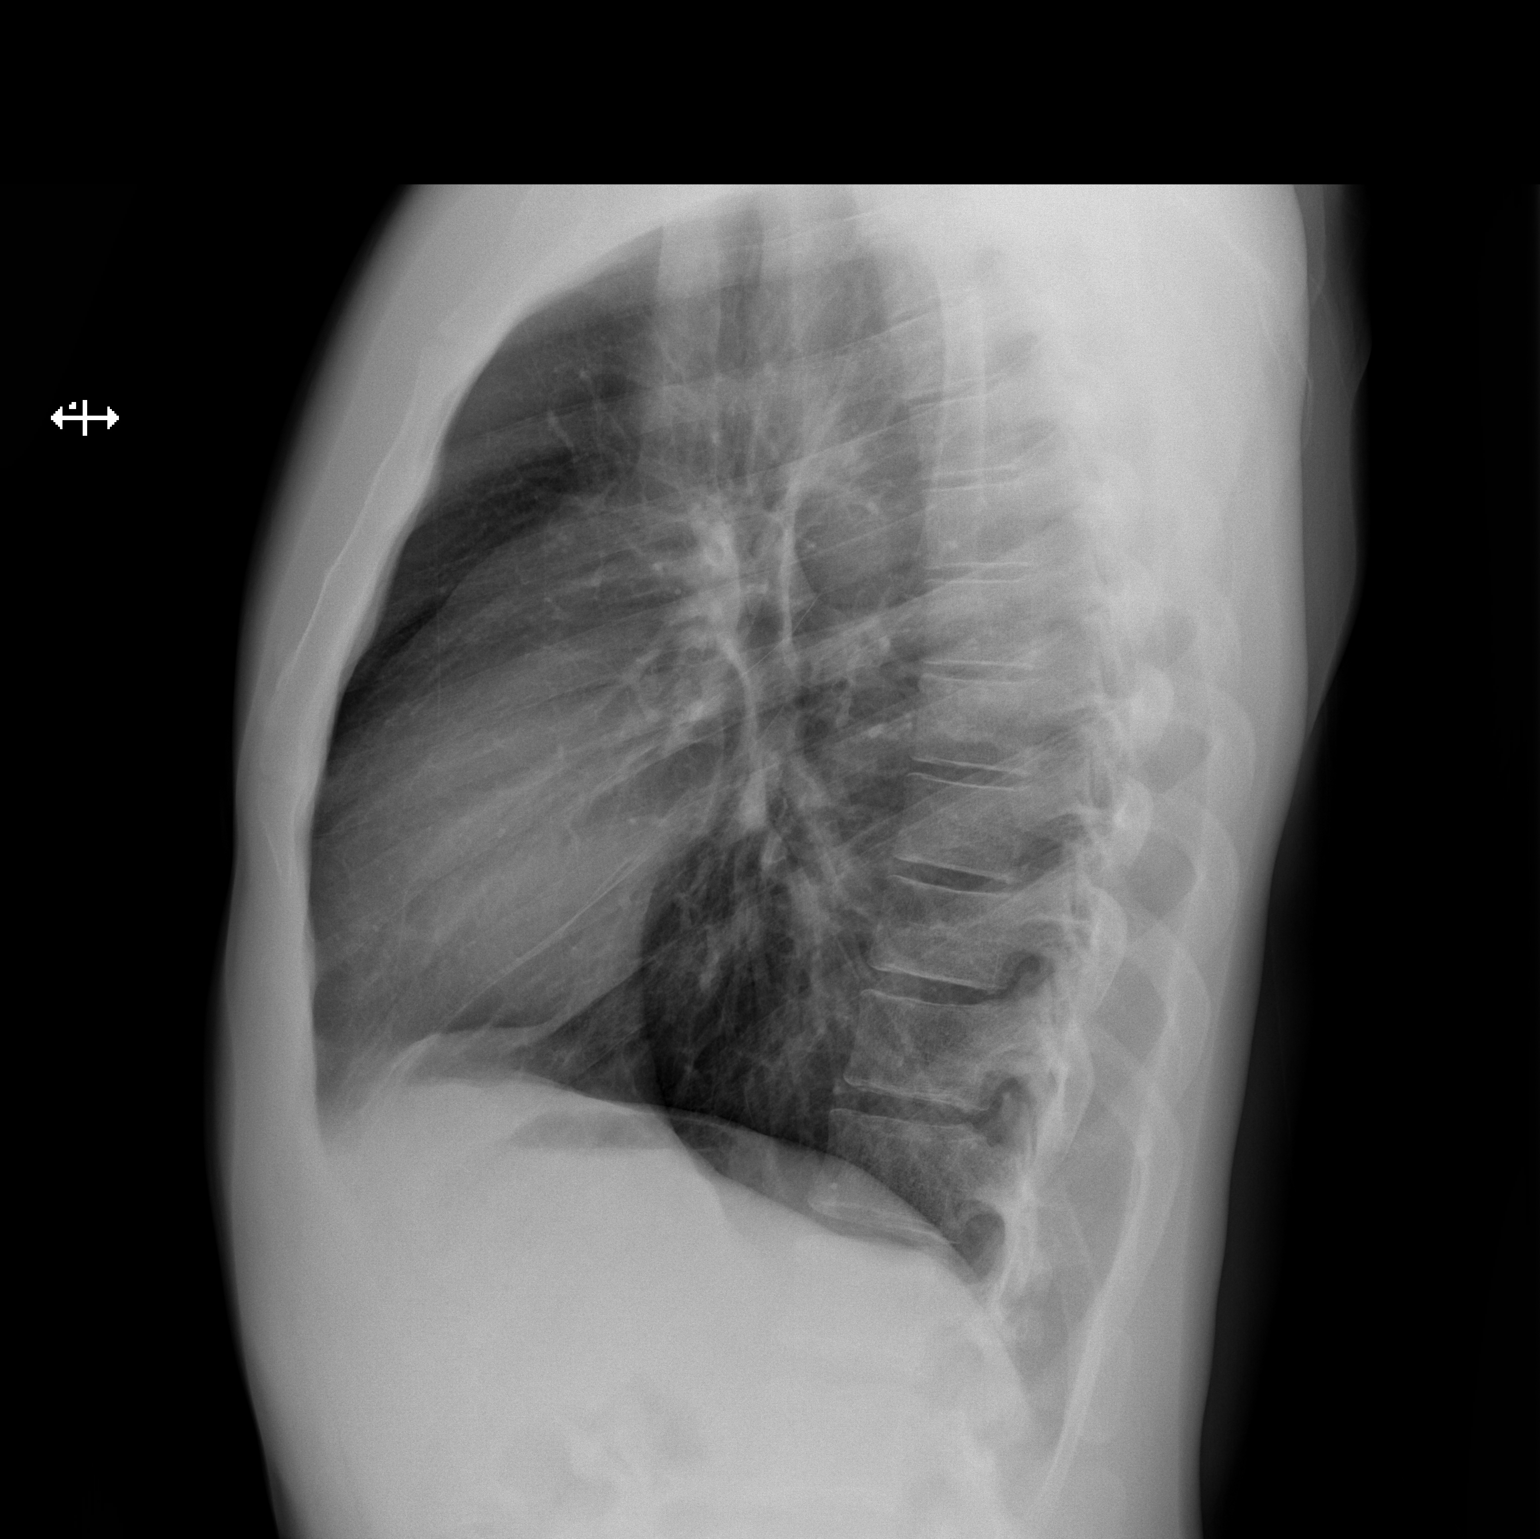

[2 of 2 positions shown; findings below may reference images not displayed]

FINDINGS: Normal mediastinum and cardiac silhouette. Normal pulmonary
vasculature. No evidence of effusion, infiltrate, or pneumothorax.
No acute bony abnormality.
IMPRESSION: Normal chest radiograph

## 2018-04-07 ENCOUNTER — Emergency Department (HOSPITAL_COMMUNITY)
Admission: EM | Admit: 2018-04-07 | Discharge: 2018-04-07 | Disposition: A | Discharge status: Home / Self Care | Payer: Self-pay | Attending: Emergency Medicine | Admitting: Emergency Medicine

## 2018-04-07 ENCOUNTER — Encounter (HOSPITAL_COMMUNITY): Payer: Self-pay | Admitting: *Deleted

## 2018-04-07 DIAGNOSIS — S025XXA Fracture of tooth (traumatic), initial encounter for closed fracture: Secondary | ICD-10-CM | POA: Insufficient documentation

## 2018-04-07 DIAGNOSIS — Y9239 Other specified sports and athletic area as the place of occurrence of the external cause: Secondary | ICD-10-CM | POA: Insufficient documentation

## 2018-04-07 DIAGNOSIS — F1210 Cannabis abuse, uncomplicated: Secondary | ICD-10-CM | POA: Insufficient documentation

## 2018-04-07 DIAGNOSIS — W500XXA Accidental hit or strike by another person, initial encounter: Secondary | ICD-10-CM | POA: Insufficient documentation

## 2018-04-07 DIAGNOSIS — Y999 Unspecified external cause status: Secondary | ICD-10-CM | POA: Insufficient documentation

## 2018-04-07 DIAGNOSIS — K029 Dental caries, unspecified: Secondary | ICD-10-CM | POA: Insufficient documentation

## 2018-04-07 DIAGNOSIS — Y9371 Activity, boxing: Secondary | ICD-10-CM | POA: Insufficient documentation

## 2018-04-07 DIAGNOSIS — K047 Periapical abscess without sinus: Secondary | ICD-10-CM | POA: Insufficient documentation

## 2018-04-07 DIAGNOSIS — F17210 Nicotine dependence, cigarettes, uncomplicated: Secondary | ICD-10-CM | POA: Insufficient documentation

## 2018-04-07 MED ORDER — AMOXICILLIN-POT CLAVULANATE 875-125 MG PO TABS
1.0000 | ORAL_TABLET | Freq: Once | ORAL | Status: AC
  Administered 2018-04-07: 1 via ORAL
  Filled 2018-04-07: qty 1

## 2018-04-07 MED ORDER — IBUPROFEN 800 MG PO TABS: 800.0000 mg | ORAL_TABLET | Freq: Three times a day (TID) | ORAL | 0 refills | Status: AC

## 2018-04-07 MED ORDER — BUPIVACAINE-EPINEPHRINE (PF) 0.5% -1:200000 IJ SOLN
1.8000 mL | Freq: Once | INTRAMUSCULAR | Status: DC
  Filled 2018-04-07: qty 1.8

## 2018-04-07 MED ORDER — AMOXICILLIN-POT CLAVULANATE 875-125 MG PO TABS: 1.0000 | ORAL_TABLET | Freq: Two times a day (BID) | ORAL | 0 refills | Status: AC

## 2018-04-07 MED ORDER — BUPIVACAINE-EPINEPHRINE (PF) 0.5% -1:200000 IJ SOLN
1.8000 mL | Freq: Once | INTRAMUSCULAR | Status: AC
  Administered 2018-04-07: 1.8 mL
  Filled 2018-04-07: qty 1.8
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
## 2018-04-07 NOTE — Discharge Instructions (Signed)
Please call to schedule a follow-up appointment with your dentist as soon as possible.  Take 1 tablet of Augmentin every 12 hours for the next 7 days.  Your first dose was given today in the emergency department.   Avoid applying heat to your face because this can cause worsening symptoms.  Swish and spit warm salt water to help with pain and to help keep the mouth clean.  Avoid using a straw until your wound heals.  Take 800 mg of ibuprofen with food once every 8 hours or thousand milligrams of Tylenol for pain control.  If you develop worsening swelling, high fever despite taking Tylenol and Motrin, the inability to open your mouth, please return to the emergency department for re-evaluation.
